# Patient Record
Sex: Male | Born: 2000 | Race: White | Hispanic: No | Marital: Single | Smoking: Never smoker
Health system: Southern US, Community
[De-identification: ages and names within clinical notes are randomized; demographics above are authoritative.]

## PROBLEM LIST (undated history)

## (undated) DIAGNOSIS — F329 Major depressive disorder, single episode, unspecified: Secondary | ICD-10-CM

## (undated) DIAGNOSIS — F32A Depression, unspecified: Secondary | ICD-10-CM

## (undated) HISTORY — DX: Depression, unspecified: F32.A

## (undated) HISTORY — PX: FACIAL COSMETIC SURGERY: SHX629

## (undated) HISTORY — DX: Major depressive disorder, single episode, unspecified: F32.9

## (undated) HISTORY — PX: HEMANGIOMA EXCISION: SHX1734

---
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
## 2018-04-19 ENCOUNTER — Telehealth: Payer: Self-pay

## 2018-04-19 NOTE — Telephone Encounter (Signed)
Patient will go to UC, establish appointment made for 9/26

## 2018-04-19 NOTE — Telephone Encounter (Signed)
Copied from [place] [id number]. Topic: Appointment Scheduling - Scheduling Inquiry for Clinic >> Apr 19, 2018  8:59 AM Synthia Innocent wrote: Reason for CRM: Requesting to establish care with Dr Damita Dunnings, mother(Tammy Mariea Clonts) is a patient. Patient currently has a ear infection. Requesting to be seen asap. Please advise  Please review Dr Damita Dunnings. Thank Edrick Kins, RMA

## 2018-04-19 NOTE — Telephone Encounter (Signed)
Advise UC today and get patient a new appointment when possible. Thanks.

## 2018-04-23 DIAGNOSIS — H66001 Acute suppurative otitis media without spontaneous rupture of ear drum, right ear: Secondary | ICD-10-CM | POA: Diagnosis not present

## 2018-05-06 ENCOUNTER — Ambulatory Visit: Payer: Self-pay | Admitting: Family Medicine

## 2018-06-15 ENCOUNTER — Encounter: Payer: Self-pay | Admitting: Family Medicine

## 2018-06-15 ENCOUNTER — Ambulatory Visit (INDEPENDENT_AMBULATORY_CARE_PROVIDER_SITE_OTHER): Payer: 59 | Admitting: Family Medicine

## 2018-06-15 VITALS — BP 110/62 | HR 74 | Temp 98.7°F | Ht 71.0 in | Wt 156.8 lb

## 2018-06-15 DIAGNOSIS — H6980 Other specified disorders of Eustachian tube, unspecified ear: Secondary | ICD-10-CM

## 2018-06-15 DIAGNOSIS — Z23 Encounter for immunization: Secondary | ICD-10-CM | POA: Diagnosis not present

## 2018-06-15 DIAGNOSIS — Z7189 Other specified counseling: Secondary | ICD-10-CM | POA: Diagnosis not present

## 2018-06-15 MED ORDER — FLUTICASONE PROPIONATE 50 MCG/ACT NA SUSP: 2.0000 | Freq: Every day | NASAL | 1 refills | Status: DC

## 2018-06-15 NOTE — Progress Notes (Signed)
New patient.    [facility] vaccination data discussed with patient.  Flu shot done at office visit.  Tdap done 2012.  Encouraged tobacco cessation.  Discussed.  History of depression.  Previously on unrecalled medication years ago.  No suicide attempt.  No suicidal or homicidal intent.  Mood has been improved for significant period of time in the interval.  History of nasal surgery for hemangioma.  Right ear pressure but not pain.  He feels like he has some fluid in the canal.  No left ear symptoms.  No fevers, chills, nausea or vomiting.  History of cerumen impaction and also acute otitis externa.  History of acute otitis media.  Advanced directive discussed with patient.  Parents designated if patient were incapacitated.  PMH and SH reviewed ROS: Per HPI unless specifically indicated in ROS section   Meds, vitals, and allergies reviewed.   GEN: nad, alert and oriented HEENT: mucous membranes moist, tympanic membranes without erythema.  Canals within normal limits bilaterally.  Eustachian tube dysfunction noted on right ear. NECK: supple w/o LA CV: rrr.  no murmur PULM: ctab, no inc wob ABD: soft, +bs EXT: no edema SKIN: no acute rash

## 2018-06-15 NOTE — Patient Instructions (Addendum)
Use flonase and gently try to pop your ears.  Update me if not better.  Take care.  Glad to see you.

## 2018-06-20 ENCOUNTER — Encounter: Payer: Self-pay | Admitting: Family Medicine

## 2018-06-20 DIAGNOSIS — H6990 Unspecified Eustachian tube disorder, unspecified ear: Secondary | ICD-10-CM | POA: Insufficient documentation

## 2018-06-20 DIAGNOSIS — Z7189 Other specified counseling: Secondary | ICD-10-CM | POA: Insufficient documentation

## 2018-06-20 DIAGNOSIS — H6980 Other specified disorders of Eustachian tube, unspecified ear: Secondary | ICD-10-CM | POA: Insufficient documentation

## 2018-06-20 NOTE — Assessment & Plan Note (Signed)
Advanced directive discussed with patient.  Parents designated if patient were incapacitated. 

## 2018-06-20 NOTE — Assessment & Plan Note (Addendum)
Use Flonase and gently perform Valsalva.  Update me as needed.  He agrees.  Anatomy discussed with patient. >20 minutes spent in face to face time with patient, >50% spent in counselling or coordination of care.

## 2018-06-24 ENCOUNTER — Encounter: Payer: Self-pay | Admitting: Family Medicine

## 2018-12-06 ENCOUNTER — Other Ambulatory Visit: Payer: Self-pay | Admitting: Family Medicine

## 2018-12-06 NOTE — Telephone Encounter (Signed)
Pharmacy requests refill on: Fluticasone   LAST REFILL:  16g, with 1 refill on 06/15/18 LAST OV: 06/15/18 estab care NEXT OV: not on schedule PHARMACY: [facility]  Will refill X 1

## 2022-08-26 ENCOUNTER — Telehealth: Payer: Self-pay | Admitting: Family Medicine

## 2022-08-26 NOTE — Telephone Encounter (Signed)
Pt's mom, Tammy called asking if Damita Dunnings would allow pt to re-establish care? Call back # [phone]

## 2022-08-27 NOTE — Telephone Encounter (Signed)
Pt's mom, Tammy, called back asking could pt possibly be seen sooner due to needing a refill on anxiety meds? Tammy states pt was seeing Dr. Toy Cookey in [place] provider recently retired last Sept. Call back # [phone]

## 2022-08-27 NOTE — Telephone Encounter (Signed)
We can use the next available slot for a new patient but that will likely be a few weeks out.  We shouldn't use same day appointment slots needed for acute patient concerns.  And the scheduling needs to go through the patient, not his mother.

## 2022-08-27 NOTE — Telephone Encounter (Signed)
Spoke with patient and he is scheduled for April.

## 2022-08-27 NOTE — Telephone Encounter (Signed)
Pt is over 18.  Scheduling needs to go through him.  Okay to set up this spring.  Thanks.

## 2022-08-28 NOTE — Telephone Encounter (Signed)
Told mother Duncan's response, she's aware we only need to speak to pt. Pt will return call to schedule sooner appt

## 2022-09-18 ENCOUNTER — Encounter: Payer: Self-pay | Admitting: Family Medicine

## 2022-09-18 ENCOUNTER — Ambulatory Visit: Payer: 59 | Admitting: Family Medicine

## 2022-09-18 VITALS — BP 118/65 | HR 82 | Temp 98.2°F | Ht 70.0 in | Wt 162.0 lb

## 2022-09-18 DIAGNOSIS — F419 Anxiety disorder, unspecified: Secondary | ICD-10-CM | POA: Diagnosis not present

## 2022-09-18 DIAGNOSIS — Z7189 Other specified counseling: Secondary | ICD-10-CM | POA: Diagnosis not present

## 2022-09-18 MED ORDER — ALPRAZOLAM 0.5 MG PO TABS: 0.2500 mg | ORAL_TABLET | Freq: Every day | ORAL | 0 refills | Status: DC | PRN

## 2022-09-18 MED ORDER — ESCITALOPRAM OXALATE 20 MG PO TABS: 20.0000 mg | ORAL_TABLET | Freq: Every day | ORAL | 3 refills | Status: DC

## 2022-09-18 NOTE — Patient Instructions (Addendum)
Ask the front about getting a record release from Dr. Launa Flight.   Take care.  Glad to see you. You can get a Tdap either here or at the pharmacy.  Update [place] as needed.   Sedation caution with xanax. Use the least amount possible.
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
## 2022-09-19 ENCOUNTER — Telehealth: Payer: Self-pay | Admitting: Family Medicine

## 2022-09-19 NOTE — Telephone Encounter (Signed)
Called Dr. Launa Flight [phone] to get records for this pt.

## 2022-09-19 NOTE — Telephone Encounter (Signed)
Release form faxed to Dr. Launa Flight 336 [phone].

## 2022-09-21 DIAGNOSIS — F419 Anxiety disorder, unspecified: Secondary | ICD-10-CM | POA: Insufficient documentation

## 2022-09-21 NOTE — Assessment & Plan Note (Signed)
Okay for outpatient follow-up. I asked patient to consider grief counseling.    No SI/HI.  No illicts, drinks some etoh on the weekends.  Discussed.  Routine cautions given to patient.  Still on lexapro, no recent use of xanax.  Routine med cautions d/w pt.  Lexapro clearly helped.  Continue Lexapro as is.  Use alprazolam only as needed.  Routine cautions given to patient.  He agrees with plan.

## 2022-09-21 NOTE — Assessment & Plan Note (Signed)
Advanced directive discussed with patient.  Parents designated if patient were incapacitated.

## 2022-11-19 ENCOUNTER — Other Ambulatory Visit: Payer: Self-pay | Admitting: Family Medicine

## 2022-11-19 ENCOUNTER — Encounter: Payer: Self-pay | Admitting: Family Medicine

## 2022-11-19 MED ORDER — ALPRAZOLAM 0.5 MG PO TABS: 0.2500 mg | ORAL_TABLET | Freq: Every day | ORAL | 0 refills | Status: DC | PRN

## 2022-11-19 NOTE — Telephone Encounter (Signed)
Refill request Alprazolam Last office visit 09/18/22 Last refill 09/18/22 No upcoming appointment scheduled

## 2022-11-24 ENCOUNTER — Ambulatory Visit: Payer: 59 | Admitting: Family Medicine

## 2022-11-28 ENCOUNTER — Encounter: Payer: Self-pay | Admitting: Family Medicine

## 2022-11-28 ENCOUNTER — Ambulatory Visit: Payer: 59 | Admitting: Family Medicine

## 2022-11-28 VITALS — BP 110/70 | HR 73 | Temp 98.4°F | Ht 70.0 in | Wt 166.0 lb

## 2022-11-28 DIAGNOSIS — F419 Anxiety disorder, unspecified: Secondary | ICD-10-CM | POA: Diagnosis not present

## 2022-11-28 DIAGNOSIS — Z23 Encounter for immunization: Secondary | ICD-10-CM

## 2022-11-28 MED ORDER — BUSPIRONE HCL 5 MG PO TABS: 5.0000 mg | ORAL_TABLET | Freq: Two times a day (BID) | ORAL | 1 refills | Status: DC

## 2022-11-28 NOTE — Progress Notes (Unsigned)
Here today with his mother.   Current Outpatient Medications on File Prior to Visit  Medication Sig Dispense Refill   ALPRAZolam (XANAX) 0.5 MG tablet Take 0.5-1 tablets (0.25-0.5 mg total) by mouth daily as needed for anxiety (sedation caution). 10 tablet 0   escitalopram (LEXAPRO) 20 MG tablet Take 1 tablet (20 mg total) by mouth daily. 90 tablet 3   fluticasone (FLONASE) 50 MCG/ACT nasal spray USE 2 SPRAYS IN EACH NOSTRIL DAILY 16 g 0   No current facility-administered medications on file prior to visit.   Work stressors d/w pt, long hours at work.  Taking xanax mult times per week, not daily.  "I'm nervous 24/7."  No SI/HI.  Lexapro helped some.  Etoh 2-3 per day.  B hand tremor noted.  He wakes up anxious.    Meds, vitals, and allergies reviewed.   ROS: Per HPI unless specifically indicated in ROS section   GEN: nad, alert and oriented HEENT: ncat NECK: supple w/o LA CV: rrr.  no murmur PULM: ctab, no inc wob ABD: soft, +bs EXT: no edema SKIN: no acute rash Speech and judgment intact/normal.

## 2022-11-28 NOTE — Patient Instructions (Signed)
Keep taking lexapro as is.  Add on buspar  twice a day.   After 1 week, try taking  twice a day.  Let me know how that goes.  Take care.  Glad to see you.

## 2022-11-30 NOTE — Assessment & Plan Note (Signed)
Discussed options and would keep taking lexapro as is.  Add on buspar  twice a day.   After 1 week, try taking  twice a day.  I asked him to me know how that goes.  Sedation caution on alprazolam, for as needed use.  Discussed tapering alcohol.

## 2023-01-08 ENCOUNTER — Other Ambulatory Visit: Payer: Self-pay | Admitting: Family Medicine

## 2023-01-09 MED ORDER — ALPRAZOLAM 0.5 MG PO TABS: 0.2500 mg | ORAL_TABLET | Freq: Every day | ORAL | 0 refills | Status: DC | PRN

## 2023-01-09 NOTE — Telephone Encounter (Signed)
Refill request for ALPRAZolam (XANAX) 0.5 MG tablet   LOV - 11/28/22 Next OV - not scheduled Last refill - 11/19/22 #10/0

## 2023-01-14 ENCOUNTER — Other Ambulatory Visit: Payer: Self-pay | Admitting: Family Medicine

## 2023-01-14 MED ORDER — BUSPIRONE HCL 15 MG PO TABS: 15.0000 mg | ORAL_TABLET | Freq: Two times a day (BID) | ORAL | 1 refills | Status: DC

## 2023-02-23 ENCOUNTER — Other Ambulatory Visit: Payer: Self-pay | Admitting: Family Medicine

## 2023-02-23 NOTE — Telephone Encounter (Signed)
Refill request for ALPRAZOLAM 0.5 MG TABS 0.5 Tablet   LOV - 11/28/22 Next OV - not scheduled Last refill - 01/09/23 #10/0

## 2023-03-25 ENCOUNTER — Other Ambulatory Visit: Payer: Self-pay | Admitting: Family Medicine

## 2023-05-25 ENCOUNTER — Other Ambulatory Visit: Payer: Self-pay | Admitting: Family Medicine

## 2023-05-25 NOTE — Telephone Encounter (Signed)
On office note 11/10/22 you wanted patient to reach our and let you know how he was doing on the medications. Did not see message or office note with that information. Ok to refill as pended.

## 2023-05-25 NOTE — Telephone Encounter (Signed)
Sent rx, please get update on patient. Thanks.

## 2023-05-26 NOTE — Telephone Encounter (Signed)
Left message advising that rx has been sent to pharmacy. Also wanted to know how the patient is doing on the medication.

## 2023-06-01 NOTE — Telephone Encounter (Signed)
Called number not in service. Sending my chart message to call office.

## 2023-08-04 ENCOUNTER — Other Ambulatory Visit: Payer: Self-pay | Admitting: Family Medicine

## 2023-09-25 ENCOUNTER — Other Ambulatory Visit: Payer: Self-pay | Admitting: Family Medicine

## 2023-09-28 NOTE — Telephone Encounter (Signed)
Last refill: escitalopram (LEXAPRO) 20 MG tablet 90 tablets 3 refills Last office visit: 11/28/22 Next office visit: nothing scheduled

## 2024-01-11 ENCOUNTER — Encounter: Payer: Self-pay | Admitting: Family Medicine

## 2024-01-11 ENCOUNTER — Ambulatory Visit: Admitting: Family Medicine

## 2024-01-11 VITALS — BP 118/62 | HR 74 | Temp 98.0°F | Ht 70.0 in | Wt 172.6 lb

## 2024-01-11 DIAGNOSIS — F419 Anxiety disorder, unspecified: Secondary | ICD-10-CM

## 2024-01-11 MED ORDER — ESCITALOPRAM OXALATE 10 MG PO TABS
10.0000 mg | ORAL_TABLET | Freq: Every day | ORAL | 1 refills | Status: DC
Start: 2024-01-11 — End: 2024-07-01

## 2024-01-11 MED ORDER — HYDROXYZINE HCL 10 MG PO TABS: 10.0000 mg | ORAL_TABLET | Freq: Three times a day (TID) | ORAL | 0 refills | Status: AC | PRN

## 2024-01-11 NOTE — Progress Notes (Unsigned)
 Last seen for anxiety last year.  Off meds currently.  Work stressors d/w pt.  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  He is going to get retested.  He is worried about the testing and "freezing up" during the exam. Mood is lower.  Less interest in activity recently.  No SI/HI.  Minimal etoh.    His mood was lower on buspar .  He is off that medication in the meantime.  Discussed previous records- he was initially treated with Prozac but had increased impulsivity. He was switched to sertraline but had irritability. Paxil and Effexor both caused sweating.   Meds, vitals, and allergies reviewed.   ROS: Per HPI unless specifically indicated in ROS section   GEN: nad, alert and oriented HEENT: ncat NECK: supple w/o LA CV: rrr PULM: ctab, no inc wob EXT: no edema No tremor.  Speech and judgment intact.

## 2024-01-11 NOTE — Patient Instructions (Signed)
 Please call about an appointment.  [address], [address] [place], [place], [address] Phone: [phone]  I would restart lexapro  in the meantime.

## 2024-01-13 NOTE — Assessment & Plan Note (Signed)
 Discussed options.  Refer to psychiatry.  He can call about appointment.  Information given to patient. I would restart lexapro  in the meantime.  He can use hydroxyzine as needed.  Routine cautions given to patient.  It may be the case that he needs extra medication in addition to Lexapro  but we know he can tolerate that medication.  I would start there.  Okay for outpatient follow-up.

## 2024-07-01 ENCOUNTER — Other Ambulatory Visit: Payer: Self-pay | Admitting: Family Medicine

## 2024-07-01 DIAGNOSIS — F419 Anxiety disorder, unspecified: Secondary | ICD-10-CM
